# Patient Record
Sex: Female | Born: 1970 | Race: Black or African American | Hispanic: No | Marital: Married | Smoking: Former smoker
Health system: Southern US, Community
[De-identification: ages and names within clinical notes are randomized; demographics above are authoritative.]

## PROBLEM LIST (undated history)

## (undated) DIAGNOSIS — O009 Unspecified ectopic pregnancy without intrauterine pregnancy: Secondary | ICD-10-CM

## (undated) DIAGNOSIS — B351 Tinea unguium: Secondary | ICD-10-CM

## (undated) HISTORY — PX: WISDOM TOOTH EXTRACTION: SHX21

## (undated) HISTORY — DX: Tinea unguium: B35.1

## (undated) HISTORY — PX: LAPAROSCOPIC GASTRIC SLEEVE RESECTION: SHX5895

## (undated) HISTORY — PX: SALPINGECTOMY: SHX328

## (undated) HISTORY — DX: Unspecified ectopic pregnancy without intrauterine pregnancy: O00.90

---

## 2015-03-19 ENCOUNTER — Encounter: Payer: Self-pay | Admitting: Obstetrics & Gynecology

## 2015-03-25 ENCOUNTER — Other Ambulatory Visit: Payer: Self-pay | Admitting: Obstetrics & Gynecology

## 2015-03-25 DIAGNOSIS — Z1231 Encounter for screening mammogram for malignant neoplasm of breast: Secondary | ICD-10-CM

## 2015-04-21 ENCOUNTER — Encounter: Payer: Self-pay | Admitting: Obstetrics & Gynecology

## 2015-04-21 ENCOUNTER — Ambulatory Visit (INDEPENDENT_AMBULATORY_CARE_PROVIDER_SITE_OTHER): Payer: 59 | Admitting: Obstetrics & Gynecology

## 2015-04-21 VITALS — BP 159/85 | HR 73 | Resp 16 | Ht 66.0 in | Wt 198.0 lb

## 2015-04-21 DIAGNOSIS — Z720 Tobacco use: Secondary | ICD-10-CM | POA: Diagnosis not present

## 2015-04-21 DIAGNOSIS — Z1151 Encounter for screening for human papillomavirus (HPV): Secondary | ICD-10-CM | POA: Diagnosis not present

## 2015-04-21 DIAGNOSIS — Z124 Encounter for screening for malignant neoplasm of cervix: Secondary | ICD-10-CM | POA: Diagnosis not present

## 2015-04-21 DIAGNOSIS — Z9884 Bariatric surgery status: Secondary | ICD-10-CM | POA: Diagnosis not present

## 2015-04-21 DIAGNOSIS — I10 Essential (primary) hypertension: Secondary | ICD-10-CM | POA: Diagnosis not present

## 2015-04-21 DIAGNOSIS — Z01419 Encounter for gynecological examination (general) (routine) without abnormal findings: Secondary | ICD-10-CM

## 2015-04-21 DIAGNOSIS — F17200 Nicotine dependence, unspecified, uncomplicated: Secondary | ICD-10-CM

## 2015-04-21 MED ORDER — NICOTINE 7 MG/24HR TD PT24: 7.0000 mg | MEDICATED_PATCH | Freq: Every day | TRANSDERMAL | Status: DC

## 2015-04-21 NOTE — Progress Notes (Signed)
  Subjective:     Donna Hartman is a 44 y.o. female here for a routine exam.  Current complaints: wants to quit smoking.  Interested in nicoderm patches.     Gynecologic History Patient's last menstrual period was 04/17/2015. Contraception: bilateral salpingectomy at time of ectopic surgery Last Pap: 2014 per patient. Results were: normal Last mammogram: 2015. Results were: normal per patient  Obstetric History OB History  Gravida Para Term Preterm AB SAB TAB Ectopic Multiple Living  5 2 2  3 1  0 1  1    # Outcome Date GA Lbr Len/2nd Weight Sex Delivery Anes PTL Lv  5 Term 01/08/98    M CS-Classical  N Y  4 Term     M CS-Classical  N   3 Ectopic           2 AB           1 SAB                The following portions of the patient's history were reviewed and updated as appropriate: allergies, current medications, past family history, past medical history, past social history, past surgical history and problem list.  Review of Systems Pertinent items are noted in HPI.    Objective:      Filed Vitals:   04/21/15 1328  BP: 162/92  Pulse: 73  Resp: 16  Height: 5\' 6"  (1.676 m)  Weight: 198 lb (89.812 kg)   Vitals:  WNL General appearance: alert, cooperative and no distress Head: Normocephalic, without obvious abnormality, atraumatic Eyes: negative Throat: lips, mucosa, and tongue normal; teeth and gums normal Lungs: clear to auscultation bilaterally Breasts: normal appearance, no masses or tenderness, No nipple retraction or dimpling, No nipple discharge or bleeding Heart: regular rate and rhythm Abdomen: soft, non-tender; bowel sounds normal; no masses,  no organomegaly  Pelvic:  External Genitalia:  Tanner V, probable vitiligo at 6 o'clock vagina and onto labia minora.  Sharp demarcation of color.  No lesion or plaque. Urethra:  No prolpase Vagina:  Pink, normal rugae, no blood or discharge Cervix:  No CMT, no lesion Uterus:  Normal size and contour, non  tender Adnexa:  Normal, no masses, non tender  Extremities: no edema, redness or tenderness in the calves or thighs Skin: pale vitiligo on abdomen Lymph nodes: Axillary adenopathy: none        Assessment:    Healthy female exam.   Hypertension Vitiligo Smoker motivated to quit   Plan:    Education reviewed: self breast exams, skin cancer screening and smoking cessation. Contraception: s/p bilateral salpingectomy. Mammogram ordered. Follow up in: 1 year. nicoderm and Strong quit line  F/U with primary care MD for hypertension Mammogram tomorrow Dentis this week Pap with cotesting RTC 1 year Self vulvar exams

## 2015-04-21 NOTE — Patient Instructions (Signed)
Smoking Cessation, Tips for Success  If you are ready to quit smoking, congratulations! You have chosen to help yourself be healthier. Cigarettes bring nicotine, tar, carbon monoxide, and other irritants into your body. Your lungs, heart, and blood vessels will be able to work better without these poisons. There are many different ways to quit smoking. Nicotine gum, nicotine patches, a nicotine inhaler, or nicotine nasal spray can help with physical craving. Hypnosis, support groups, and medicines help break the habit of smoking.  WHAT THINGS CAN I DO TO MAKE QUITTING EASIER?   Here are some tips to help you quit for good:  · Pick a date when you will quit smoking completely. Tell all of your friends and family about your plan to quit on that date.  · Do not try to slowly cut down on the number of cigarettes you are smoking. Pick a quit date and quit smoking completely starting on that day.  · Throw away all cigarettes.    · Clean and remove all ashtrays from your home, work, and car.  · On a card, write down your reasons for quitting. Carry the card with you and read it when you get the urge to smoke.  · Cleanse your body of nicotine. Drink enough water and fluids to keep your urine clear or pale yellow. Do this after quitting to flush the nicotine from your body.  · Learn to predict your moods. Do not let a bad situation be your excuse to have a cigarette. Some situations in your life might tempt you into wanting a cigarette.  · Never have "just one" cigarette. It leads to wanting another and another. Remind yourself of your decision to quit.  · Change habits associated with smoking. If you smoked while driving or when feeling stressed, try other activities to replace smoking. Stand up when drinking your coffee. Brush your teeth after eating. Sit in a different chair when you read the paper. Avoid alcohol while trying to quit, and try to drink fewer caffeinated beverages. Alcohol and caffeine may urge you to  smoke.  · Avoid foods and drinks that can trigger a desire to smoke, such as sugary or spicy foods and alcohol.  · Ask people who smoke not to smoke around you.  · Have something planned to do right after eating or having a cup of coffee. For example, plan to take a walk or exercise.  · Try a relaxation exercise to calm you down and decrease your stress. Remember, you may be tense and nervous for the first 2 weeks after you quit, but this will pass.  · Find new activities to keep your hands busy. Play with a pen, coin, or rubber band. Doodle or draw things on paper.  · Brush your teeth right after eating. This will help cut down on the craving for the taste of tobacco after meals. You can also try mouthwash.    · Use oral substitutes in place of cigarettes. Try using lemon drops, carrots, cinnamon sticks, or chewing gum. Keep them handy so they are available when you have the urge to smoke.  · When you have the urge to smoke, try deep breathing.  · Designate your home as a nonsmoking area.  · If you are a heavy smoker, ask your health care provider about a prescription for nicotine chewing gum. It can ease your withdrawal from nicotine.  · Reward yourself. Set aside the cigarette money you save and buy yourself something nice.  · Look for   support from others. Join a support group or smoking cessation program. Ask someone at home or at work to help you with your plan to quit smoking.  · Always ask yourself, "Do I need this cigarette or is this just a reflex?" Tell yourself, "Today, I choose not to smoke," or "I do not want to smoke." You are reminding yourself of your decision to quit.  · Do not replace cigarette smoking with electronic cigarettes (commonly called e-cigarettes). The safety of e-cigarettes is unknown, and some may contain harmful chemicals.  · If you relapse, do not give up! Plan ahead and think about what you will do the next time you get the urge to smoke.  HOW WILL I FEEL WHEN I QUIT SMOKING?  You  may have symptoms of withdrawal because your body is used to nicotine (the addictive substance in cigarettes). You may crave cigarettes, be irritable, feel very hungry, cough often, get headaches, or have difficulty concentrating. The withdrawal symptoms are only temporary. They are strongest when you first quit but will go away within 10-14 days. When withdrawal symptoms occur, stay in control. Think about your reasons for quitting. Remind yourself that these are signs that your body is healing and getting used to being without cigarettes. Remember that withdrawal symptoms are easier to treat than the major diseases that smoking can cause.   Even after the withdrawal is over, expect periodic urges to smoke. However, these cravings are generally short lived and will go away whether you smoke or not. Do not smoke!  WHAT RESOURCES ARE AVAILABLE TO HELP ME QUIT SMOKING?  Your health care provider can direct you to community resources or hospitals for support, which may include:  · Group support.  · Education.  · Hypnosis.  · Therapy.  Document Released: 09/02/2004 Document Revised: 04/21/2014 Document Reviewed: 05/23/2013  Exit[employer]® Patient Information ©2015 [employer]. This information is not intended to replace advice given to you by your health care provider. Make sure you discuss any questions you have with your health care provider.

## 2015-04-22 ENCOUNTER — Ambulatory Visit (INDEPENDENT_AMBULATORY_CARE_PROVIDER_SITE_OTHER): Payer: 59

## 2015-04-22 DIAGNOSIS — Z1231 Encounter for screening mammogram for malignant neoplasm of breast: Secondary | ICD-10-CM

## 2015-04-22 DIAGNOSIS — R928 Other abnormal and inconclusive findings on diagnostic imaging of breast: Secondary | ICD-10-CM

## 2015-04-23 LAB — CYTOLOGY - PAP

## 2015-04-24 ENCOUNTER — Encounter: Payer: Self-pay | Admitting: Obstetrics & Gynecology

## 2015-04-24 ENCOUNTER — Other Ambulatory Visit: Payer: Self-pay | Admitting: Obstetrics & Gynecology

## 2015-04-24 DIAGNOSIS — R928 Other abnormal and inconclusive findings on diagnostic imaging of breast: Secondary | ICD-10-CM

## 2015-04-24 DIAGNOSIS — Z1231 Encounter for screening mammogram for malignant neoplasm of breast: Secondary | ICD-10-CM | POA: Insufficient documentation

## 2015-04-27 ENCOUNTER — Telehealth: Payer: Self-pay | Admitting: *Deleted

## 2015-04-27 NOTE — Telephone Encounter (Signed)
Called pt to adv further imaging needed on left breast. Pt states she does not have appt scheduled as of now and has not been notified by imaging that further studies are needed. I gave pt phone number to call and schedule imaging. Pt will call back to adv once she has made the appt.

## 2015-04-27 NOTE — Telephone Encounter (Signed)
-----   Message from Lesly DukesKelly H Leggett, MD sent at 04/24/2015  2:03 PM EDT ----- Please make sure patient has f/u for further imaging--document in Epic

## 2015-04-28 ENCOUNTER — Other Ambulatory Visit: Payer: Self-pay | Admitting: Obstetrics & Gynecology

## 2015-04-28 ENCOUNTER — Other Ambulatory Visit: Payer: Self-pay

## 2015-04-28 DIAGNOSIS — R928 Other abnormal and inconclusive findings on diagnostic imaging of breast: Secondary | ICD-10-CM

## 2015-04-29 ENCOUNTER — Ambulatory Visit
Admission: RE | Admit: 2015-04-29 | Discharge: 2015-04-29 | Disposition: A | Discharge status: Home / Self Care | Payer: 59 | Source: Ambulatory Visit | Attending: Obstetrics & Gynecology | Admitting: Obstetrics & Gynecology

## 2015-04-29 DIAGNOSIS — R928 Other abnormal and inconclusive findings on diagnostic imaging of breast: Secondary | ICD-10-CM

## 2016-05-11 DIAGNOSIS — E559 Vitamin D deficiency, unspecified: Secondary | ICD-10-CM | POA: Insufficient documentation

## 2016-07-05 ENCOUNTER — Emergency Department
Admission: EM | Admit: 2016-07-05 | Discharge: 2016-07-05 | Disposition: A | Discharge status: Home / Self Care | Payer: 59 | Source: Home / Self Care | Attending: Emergency Medicine | Admitting: Emergency Medicine

## 2016-07-05 ENCOUNTER — Encounter: Payer: Self-pay | Admitting: Emergency Medicine

## 2016-07-05 DIAGNOSIS — N3000 Acute cystitis without hematuria: Secondary | ICD-10-CM

## 2016-07-05 DIAGNOSIS — B3731 Acute candidiasis of vulva and vagina: Secondary | ICD-10-CM

## 2016-07-05 DIAGNOSIS — B373 Candidiasis of vulva and vagina: Secondary | ICD-10-CM

## 2016-07-05 LAB — POCT URINALYSIS DIP (MANUAL ENTRY)
Bilirubin, UA: NEGATIVE
Blood, UA: NEGATIVE
Glucose, UA: NEGATIVE
Nitrite, UA: POSITIVE — AB
Protein Ur, POC: 30 — AB
Spec Grav, UA: 1.025 (ref 1.005–1.03)
Urobilinogen, UA: 1 (ref 0–1)
pH, UA: 6 (ref 5–8)

## 2016-07-05 MED ORDER — CIPROFLOXACIN HCL 500 MG PO TABS: 500.0000 mg | ORAL_TABLET | Freq: Two times a day (BID) | ORAL | Status: DC

## 2016-07-05 MED ORDER — FLUCONAZOLE 150 MG PO TABS: 150.0000 mg | ORAL_TABLET | Freq: Once | ORAL | Status: DC

## 2016-07-05 NOTE — ED Provider Notes (Signed)
CSN: [id number]     Arrival date & time 07/05/16  1840 History   First MD Initiated Contact with Patient 07/05/16 1858     Chief Complaint  Patient presents with  . Vaginal Itching  . Dysuria   (Consider location/radiation/quality/duration/timing/severity/associated sxs/prior Treatment) HPI 45 year old female. Complains of moderate to severe vaginal itching and mild dysuria for 2 days. She states this started about 7 days after taking an antibiotic prescribed by her dentist after a tooth was pulled. Currently denies any oral or ENT symptoms. She states this feels exactly like a vaginal yeast infection and a mild urinary tract infection which she's had in the remote past. She denies chance of pregnancy. Last menstrual period 06/21/2016. Denies vaginal discharge or abdominal or pelvic or back pain. No nausea or vomiting or fever or chills. She otherwise feels well. She states she is monogamous with husband and she denies there is any chance of STD and declines any pelvic exam or STD testing at this time.  Past Medical History  Diagnosis Date  . Nail fungus   . Ectopic pregnancy    Past Surgical History  Procedure Laterality Date  . Cesarean section      x 2  . Salpingectomy      bilat  . Wisdom tooth extraction    . Laparoscopic gastric sleeve resection    BTL  Family History  Problem Relation Age of Onset  . Cancer Mother     stomach  . Hypertension Maternal Grandmother   . Hypertension Paternal Grandmother   . Cancer Paternal Grandfather     prostate  . HIV Father    Social History  Substance Use Topics  . Smoking status: Current Every Day Smoker -- 0.25 packs/day for 10 years    Types: Cigarettes  . Smokeless tobacco: Never Used  . Alcohol Use: 0.0 oz/week    0 Standard drinks or equivalent per week     Comment: social   OB History    Gravida Para Term Preterm AB TAB SAB Ectopic Multiple Living   0 Review of Systems  All other systems  reviewed and are negative.   Allergies  Lamisil  Home Medications   Prior to Admission medications   Medication Sig Start Date End Date Taking? Authorizing Provider  ciprofloxacin (CIPRO) 500 MG tablet Take 1 tablet (500 mg total) by mouth 2 (two) times daily. For 7 days 07/05/16   Lajean Manes, MD  fluconazole (DIFLUCAN) 150 MG tablet Take 1 tablet (150 mg total) by mouth once. Take 1 now, then may repeat x1 in 4 days, for yeast infection. 07/05/16   Lajean Manes, MD  JUBLIA 10 % SOLN  04/12/15   Historical Provider, MD  nicotine (NICODERM CQ) 7 mg/24hr patch Place 1 patch (7 mg total) onto the skin daily. 04/21/15   Lesly Dukes, MD   Meds Ordered and Administered this Visit  Medications - No data to display  BP 130/85 mmHg  Pulse 85  Temp(Src) 98 F (36.7 C) (Oral)  Resp 16  Ht  (1.676 m)  Wt 194 lb (87.998 kg)  BMI 31.33 kg/m2  SpO2 99% No data found.   Physical Exam  Constitutional: She is oriented to person, place, and time. She appears well-developed and well-nourished. No distress.  HENT:  Head: Normocephalic and atraumatic.  Eyes: Conjunctivae and EOM are normal. Pupils are equal, round, and reactive to light. No scleral  icterus.  Neck: Normal range of motion.  Cardiovascular: Normal rate.   Pulmonary/Chest: Effort normal.  Abdominal: She exhibits no distension.  Musculoskeletal: Normal range of motion.  Neurological: She is alert and oriented to person, place, and time.  Skin: Skin is warm. No rash noted.  Psychiatric: She has a normal mood and affect.  Nursing note and vitals reviewed.   ED Course  Procedures (including critical care time)  Labs Review Labs Reviewed  POCT URINALYSIS DIP (MANUAL ENTRY) - Abnormal; Notable for the following:    Clarity, UA cloudy (*)    Ketones, POC UA trace (5) (*)    Protein Ur, POC =30 (*)    Nitrite, UA Positive (*)    Leukocytes, UA small (1+) (*)    All other components within normal limits  URINE CULTURE     Imaging Review No results found.    MDM   1. Acute cystitis without hematuria   2. Yeast vaginitis    Urine culture sent. She declined any further exam or testing. Treatment options discussed, as well as risks, benefits, alternatives. Patient voiced understanding and agreement with the following plans:  Discharge Medication List as of 07/05/2016  7:37 PM    START taking these medications   Details  ciprofloxacin (CIPRO) 500 MG tablet Take 1 tablet (500 mg total) by mouth 2 (two) times daily. For 7 days, Starting 07/05/2016, Until Discontinued, Normal    fluconazole (DIFLUCAN) 150 MG tablet Take 1 tablet (150 mg total) by mouth once. Take 1 now, then may repeat x1 in 4 days, for yeast infection., Starting 07/05/2016, Normal       Other symptomatic care discussed. Follow-up with your gyn in 5-7 days if not improving, or sooner if symptoms become worse. Precautions discussed. Red flags discussed. Questions invited and answered. Patient voiced understanding and agreement.       Lajean Manesavid Massey, MD 07/05/16 [phone]

## 2016-07-05 NOTE — ED Notes (Signed)
Reports onset of vaginal itching without foul discharge and dysuria 2 days; husband is having itching of groin also and is here to be seen.

## 2016-07-08 ENCOUNTER — Telehealth: Payer: Self-pay

## 2016-07-08 NOTE — Telephone Encounter (Signed)
Feeling better.  Symptoms are resolving.

## 2016-07-09 LAB — URINE CULTURE: Colony Count: >=100000

## 2016-07-10 ENCOUNTER — Telehealth: Payer: Self-pay | Admitting: Emergency Medicine

## 2016-07-10 NOTE — Telephone Encounter (Signed)
Pt informed of results and rx called into pharmacy.  Per Dr Cathren Harsh, antb switched from Cipro to [facility].  TMartin,CMA

## 2016-11-08 ENCOUNTER — Other Ambulatory Visit: Payer: Self-pay | Admitting: Family Medicine

## 2016-11-08 DIAGNOSIS — Z1231 Encounter for screening mammogram for malignant neoplasm of breast: Secondary | ICD-10-CM

## 2016-11-22 ENCOUNTER — Ambulatory Visit (INDEPENDENT_AMBULATORY_CARE_PROVIDER_SITE_OTHER): Payer: 59

## 2016-11-22 DIAGNOSIS — Z1231 Encounter for screening mammogram for malignant neoplasm of breast: Secondary | ICD-10-CM | POA: Diagnosis not present

## 2017-02-09 ENCOUNTER — Ambulatory Visit: Payer: 59 | Admitting: Obstetrics & Gynecology

## 2017-05-02 ENCOUNTER — Ambulatory Visit (INDEPENDENT_AMBULATORY_CARE_PROVIDER_SITE_OTHER): Payer: 59 | Admitting: Obstetrics & Gynecology

## 2017-05-02 ENCOUNTER — Encounter: Payer: Self-pay | Admitting: Obstetrics & Gynecology

## 2017-05-02 VITALS — BP 126/85 | HR 73 | Ht 66.0 in | Wt 185.0 lb

## 2017-05-02 DIAGNOSIS — Z1151 Encounter for screening for human papillomavirus (HPV): Secondary | ICD-10-CM | POA: Diagnosis not present

## 2017-05-02 DIAGNOSIS — Z01419 Encounter for gynecological examination (general) (routine) without abnormal findings: Secondary | ICD-10-CM | POA: Diagnosis not present

## 2017-05-02 DIAGNOSIS — Z124 Encounter for screening for malignant neoplasm of cervix: Secondary | ICD-10-CM

## 2017-05-02 NOTE — Progress Notes (Signed)
Subjective:    Ritta SlotLatoshia Boline is a 46 y.o. M AA P2 91(19 and 46 yo kids) female who presents for an annual exam. The patient has no complaints today except for right nipple itching about 6 months. The patient is sexually active. GYN screening history: last pap: was normal. The patient wears seatbelts: yes. The patient participates in regular exercise: no. Has the patient ever been transfused or tattooed?: yes. The patient reports that there is not domestic violence in her life.   Menstrual History: OB History    Gravida Para Term Preterm AB Living   5 2 2   3 1    SAB TAB Ectopic Multiple Live Births   1 0 1   1      Menarche age: [age] Patient's last menstrual period was 04/12/2017.    The following portions of the patient's history were reviewed and updated as appropriate: allergies, current medications, past family history, past medical history, past social history, past surgical history and problem list.  Review of Systems Pertinent items are noted in HPI.   Married for 4 years, monogamous for 7 years, denies dyspareunia Had BS for contraception. Works at [employer] FH- no breast/gyn/colon cancer Mammogram 12/17   Objective:    BP 126/85   Pulse 73   Ht 5\' 6"  (1.676 m)   Wt 185 lb (83.9 kg)   LMP 04/12/2017   BMI 29.86 kg/m   General Appearance:    Alert, cooperative, no distress, appears stated age  Head:    Normocephalic, without obvious abnormality, atraumatic  Eyes:    PERRL, conjunctiva/corneas clear, EOM's intact, fundi    benign, both eyes  Ears:    Normal TM's and external ear canals, both ears  Nose:   Nares normal, septum midline, mucosa normal, no drainage    or sinus tenderness  Throat:   Lips, mucosa, and tongue normal; teeth and gums normal  Neck:   Supple, symmetrical, trachea midline, no adenopathy;    thyroid:  no enlargement/tenderness/nodules; no carotid   bruit or JVD  Back:     Symmetric, no curvature, ROM normal, no CVA tenderness  Lungs:     Clear  to auscultation bilaterally, respirations unlabored  Chest Wall:    No tenderness or deformity   Heart:    Regular rate and rhythm, S1 and S2 normal, no murmur, rub   or gallop  Breast Exam:    No tenderness, masses, or nipple abnormality  Abdomen:     Soft, non-tender, bowel sounds active all four quadrants,    no masses, no organomegaly  Genitalia:    Normal female without lesion, discharge or tenderness, NSSA, NT, no adnexal masses palpable     Extremities:   Extremities normal, atraumatic, no cyanosis or edema  Pulses:   2+ and symmetric all extremities  Skin:   Skin color, texture, turgor normal, no rashes or lesions  Lymph nodes:   Cervical, supraclavicular, and axillary nodes normal  Neurologic:   CNII-XII intact, normal strength, sensation and reflexes    throughout  .    Assessment:    Healthy female exam.    Plan:     Thin prep Pap smear. with cotesting

## 2017-05-04 LAB — CYTOLOGY - PAP
ADEQUACY: ABSENT
Diagnosis: NEGATIVE
HPV: NOT DETECTED

## 2017-12-29 ENCOUNTER — Other Ambulatory Visit: Payer: Self-pay | Admitting: Family Medicine

## 2017-12-29 DIAGNOSIS — Z1231 Encounter for screening mammogram for malignant neoplasm of breast: Secondary | ICD-10-CM

## 2018-01-24 ENCOUNTER — Ambulatory Visit (INDEPENDENT_AMBULATORY_CARE_PROVIDER_SITE_OTHER): Payer: Managed Care, Other (non HMO)

## 2018-01-24 DIAGNOSIS — Z1231 Encounter for screening mammogram for malignant neoplasm of breast: Secondary | ICD-10-CM

## 2018-09-17 ENCOUNTER — Encounter: Payer: Self-pay | Admitting: Obstetrics & Gynecology

## 2018-09-17 ENCOUNTER — Ambulatory Visit (INDEPENDENT_AMBULATORY_CARE_PROVIDER_SITE_OTHER): Payer: Managed Care, Other (non HMO) | Admitting: Obstetrics & Gynecology

## 2018-09-17 VITALS — BP 151/94 | HR 72 | Resp 16 | Ht 66.0 in | Wt 214.0 lb

## 2018-09-17 DIAGNOSIS — Z01419 Encounter for gynecological examination (general) (routine) without abnormal findings: Secondary | ICD-10-CM | POA: Diagnosis not present

## 2018-09-17 DIAGNOSIS — Z124 Encounter for screening for malignant neoplasm of cervix: Secondary | ICD-10-CM | POA: Diagnosis not present

## 2018-09-17 DIAGNOSIS — Z1151 Encounter for screening for human papillomavirus (HPV): Secondary | ICD-10-CM

## 2018-09-17 DIAGNOSIS — K649 Unspecified hemorrhoids: Secondary | ICD-10-CM

## 2018-09-17 NOTE — Progress Notes (Signed)
Formatting of this note is different from the original.  Subjective:      Emily Nelson is a 47 y.o. female here for a routine exam.  Current complaints: wants to lose weight (recently quit smoking --very exciting).  Still has monthly menses.  Pt complaining of hemorrhoids and would like them removed.     Gynecologic History  Patient's last menstrual period was 08/17/2018.  Contraception: salpingetomy  Last Pap: 2018. Results were: normal  Last mammogram: Feb 2019. Results were: normal    Obstetric History  OB History   Gravida Para Term Preterm AB Living   5 2 2   3 1    SAB TAB Ectopic Multiple Live Births   1 0 1   1     # Outcome Date GA Lbr Len/2nd Weight Sex Delivery Anes PTL Lv   5 Term 01/08/98    M CS-Classical  N LIV   4 Term     M CS-Classical  N    3 Ectopic            2 AB            1 SAB              The following portions of the patient's history were reviewed and updated as appropriate: allergies, current medications, past family history, past medical history, past social history, past surgical history and problem list.    Review of Systems  Pertinent items noted in HPI and remainder of comprehensive ROS otherwise negative.     Objective:       Vitals:    09/17/18 1337   BP: (!) 151/94   Pulse: 72   Resp: 16   Weight: 214 lb (97.1 kg)   Height: 5\' 6"  (1.676 m)     Vitals:  WNL  General appearance: alert, cooperative and no distress    HEENT: Normocephalic, without obvious abnormality, atraumatic  Eyes: negative  Throat: lips, mucosa, and tongue normal; teeth and gums normal   Respiratory: Clear to auscultation bilaterally   CV: Regular rate and rhythm   Breasts:  Normal appearance, no masses or tenderness, no nipple retraction or dimpling   GI: Soft, non-tender; bowel sounds normal; no masses,  no organomegaly; hemorrhoids present.    GU: External Genitalia:  Tanner V, no lesion  Urethra:  No prolapse    Vagina: Pink, normal rugae, no blood or discharge,    Cervix: No CMT, no lesion   Uterus:   Normal size and contour, non tender   Adnexa: Normal, no masses, non tender   Musculoskeletal: No edema, redness or tenderness in the calves or thighs   Skin: No lesions or rash   Lymphatic: Axillary adenopathy: none      Psychiatric: Normal mood and behavior     Assessment:      Healthy female exam.    HTN  Successful smoking cessation!    Plan:     Pap nml last year--Pap due in 2021  Yearly mammogram  Weight loss--restarting exercise; weight wathers option for calorie counting  PCP for other health maintenance  Referral to General surgery for evaluation of hemorrhoid removal.      Electronically signed by Lesly DukesLeggett, Kelly H, MD at 10/08/2018  8:42 AM EDT

## 2018-09-17 NOTE — Progress Notes (Addendum)
Subjective:     Donna Hartman is a 47 y.o. female here for a routine exam.  Current complaints: wants to lose weight (recently quit smoking --very exciting).  Still has monthly menses.  Pt complaining of hemorrhoids and would like them removed.    Gynecologic History Patient's last menstrual period was 08/17/2018. Contraception: salpingetomy Last Pap: 2018. Results were: normal Last mammogram: Feb 2019. Results were: normal  Obstetric History OB History  Gravida Para Term Preterm AB Living  5 2 2   3 1   SAB TAB Ectopic Multiple Live Births  1 0 1   1    # Outcome Date GA Lbr Len/2nd Weight Sex Delivery Anes PTL Lv  5 Term 01/08/98    M CS-Classical  N LIV  4 Term     M CS-Classical  N   3 Ectopic           2 AB           1 SAB              The following portions of the patient's history were reviewed and updated as appropriate: allergies, current medications, past family history, past medical history, past social history, past surgical history and problem list.  Review of Systems Pertinent items noted in HPI and remainder of comprehensive ROS otherwise negative.    Objective:      Vitals:   09/17/18 1337  BP: (!) 151/94  Pulse: 72  Resp: 16  Weight: 214 lb (97.1 kg)  Height: 5\' 6"  (1.676 m)   Vitals:  WNL General appearance: alert, cooperative and no distress  HEENT: Normocephalic, without obvious abnormality, atraumatic Eyes: negative Throat: lips, mucosa, and tongue normal; teeth and gums normal  Respiratory: Clear to auscultation bilaterally  CV: Regular rate and rhythm  Breasts:  Normal appearance, no masses or tenderness, no nipple retraction or dimpling  GI: Soft, non-tender; bowel sounds normal; no masses,  no organomegaly; hemorrhoids present.   GU: External Genitalia:  Tanner V, no lesion Urethra:  No prolapse   Vagina: Pink, normal rugae, no blood or discharge,   Cervix: No CMT, no lesion  Uterus:  Normal size and contour, non tender  Adnexa:  Normal, no masses, non tender  Musculoskeletal: No edema, redness or tenderness in the calves or thighs  Skin: No lesions or rash  Lymphatic: Axillary adenopathy: none     Psychiatric: Normal mood and behavior    Assessment:    Healthy female exam.   HTN Successful smoking cessation!   Plan:   Pap nml last year--Pap due in 2021 Yearly mammogram Weight loss--restarting exercise; weight wathers option for calorie counting PCP for other health maintenance Referral to General surgery for evaluation of hemorrhoid removal.

## 2018-09-19 LAB — CYTOLOGY - PAP
Adequacy: ABSENT
Diagnosis: NEGATIVE
HPV: NOT DETECTED

## 2019-04-29 IMAGING — MG DIGITAL SCREENING BILATERAL MAMMOGRAM WITH TOMO AND CAD
9 series · 9 of 25 positions shown · non-contrast
Comparison: Previous exam(s).

CLINICAL DATA: Screening.

EXAM:
DIGITAL SCREENING BILATERAL MAMMOGRAM WITH TOMO AND CAD

[R CC (1 of 2)]
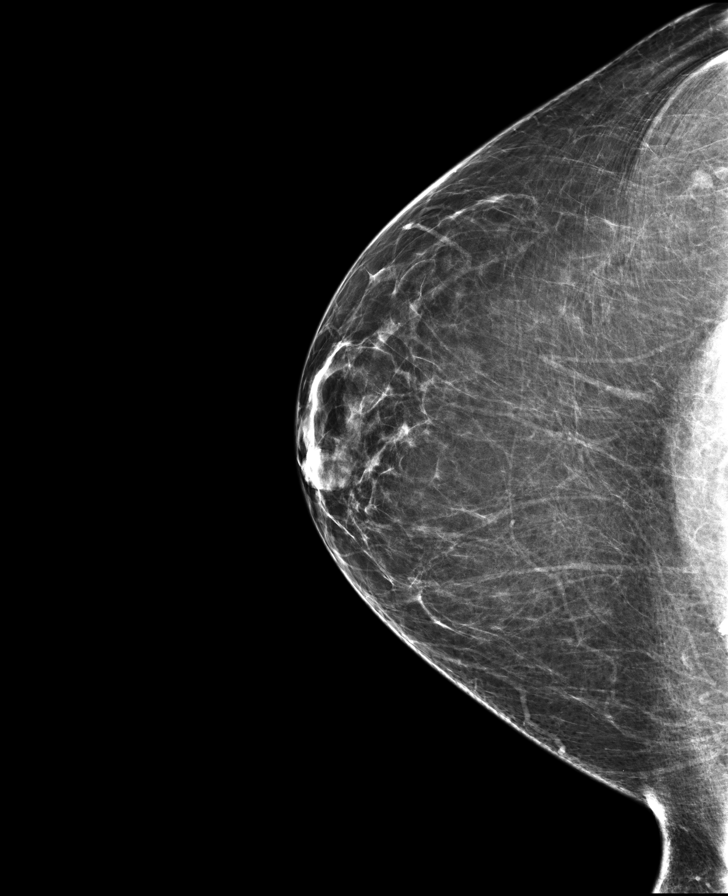

[R MLO]
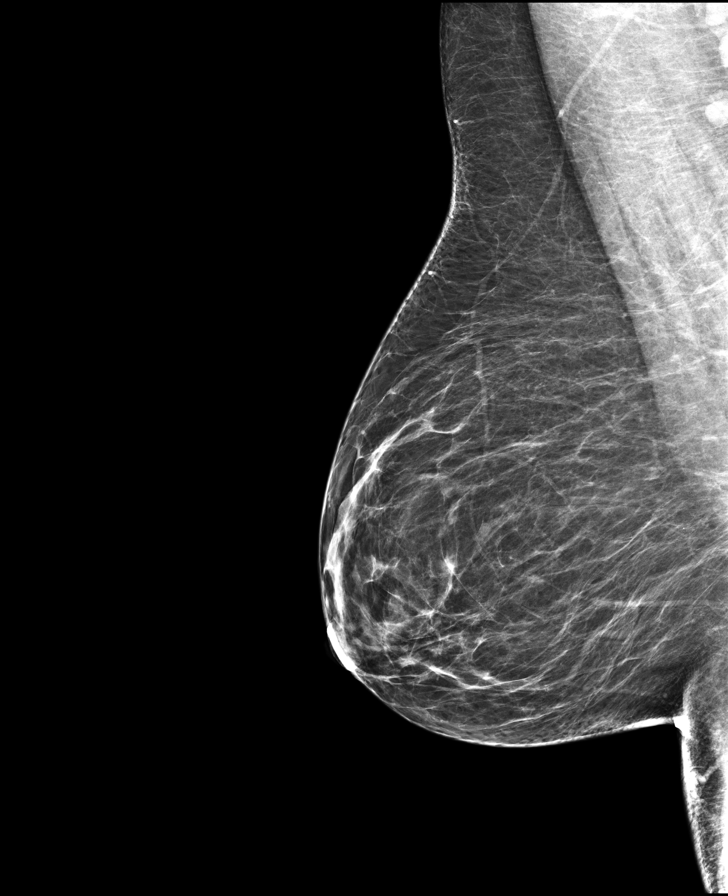

[L MLO]
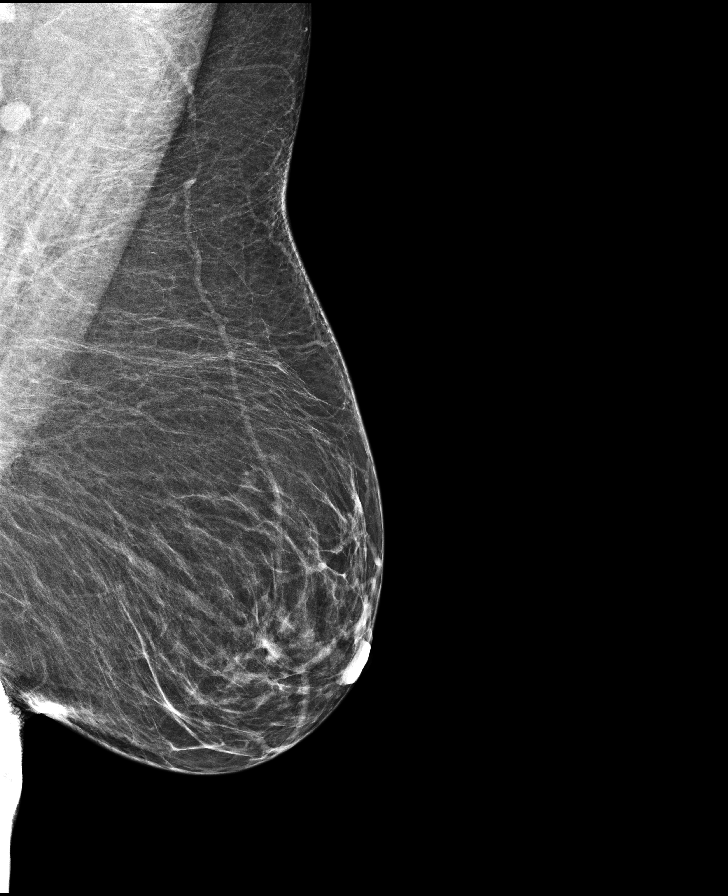

[R CC (2 of 2)]
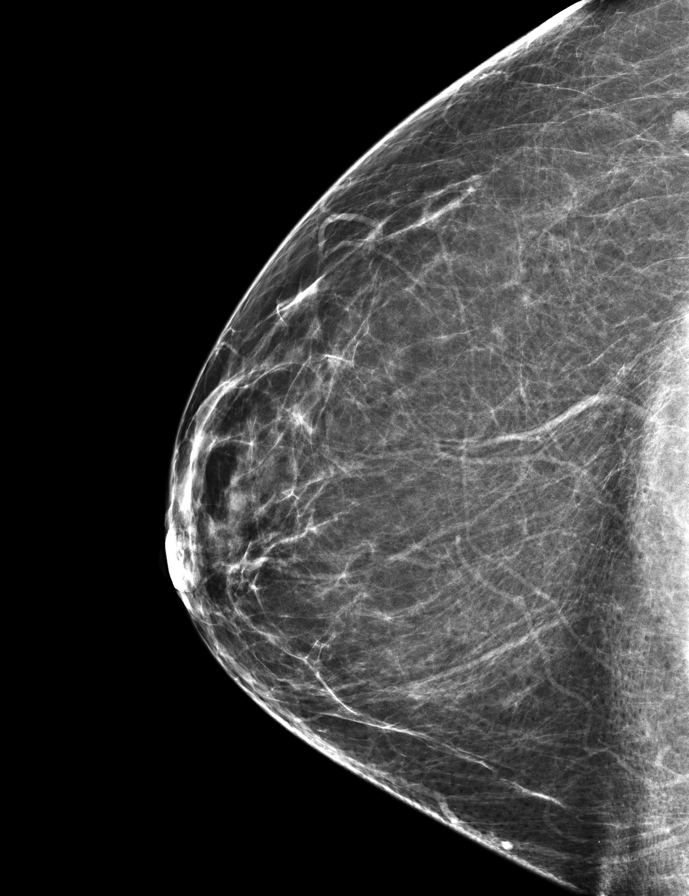

[L CC]
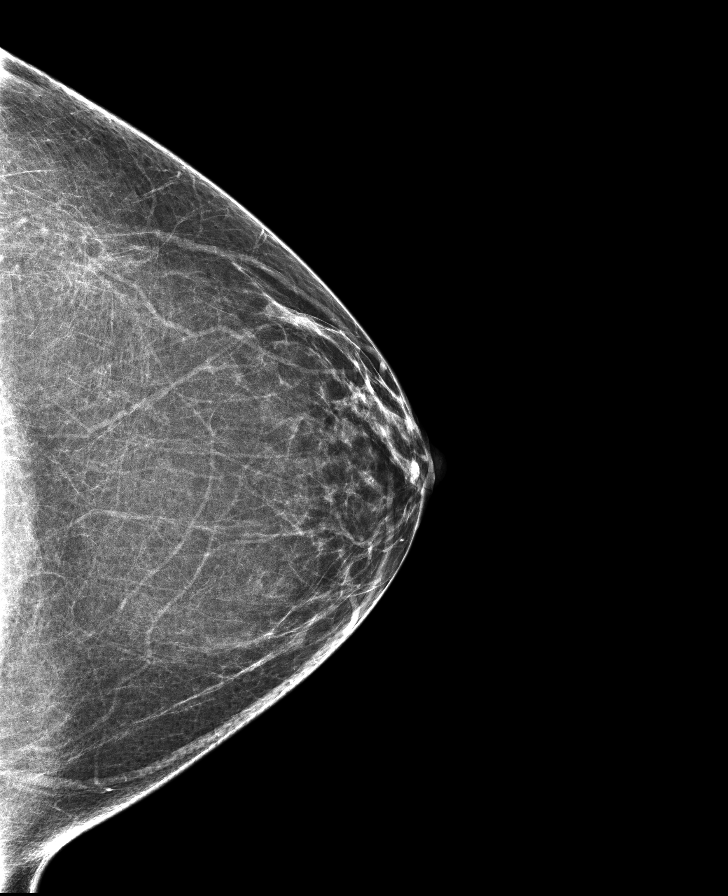

[L MLO tomo · tomo slice 45/88.0]
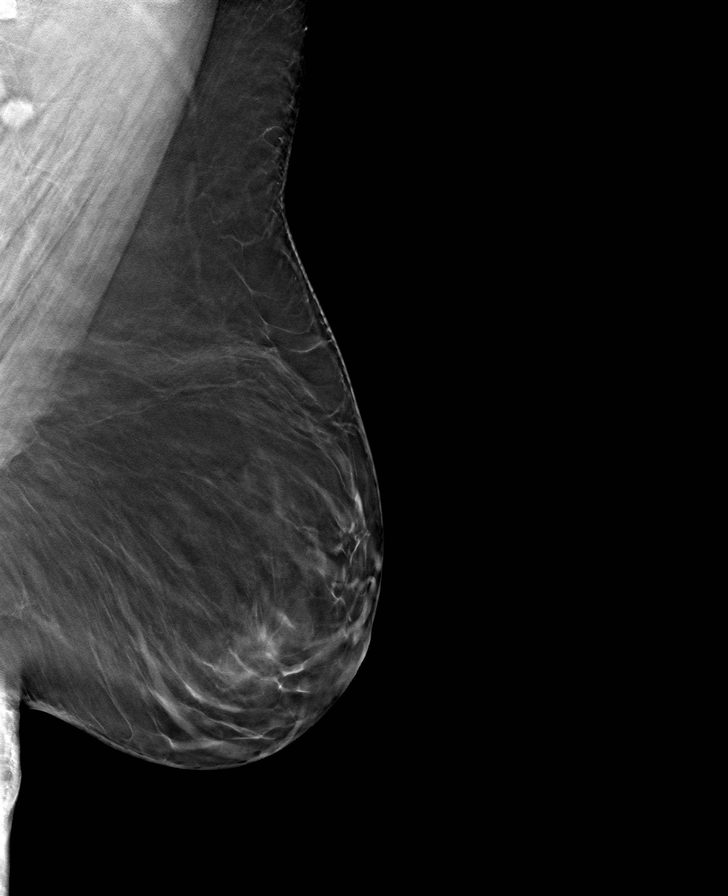

[R CC tomo · tomo slice 41/81.0]
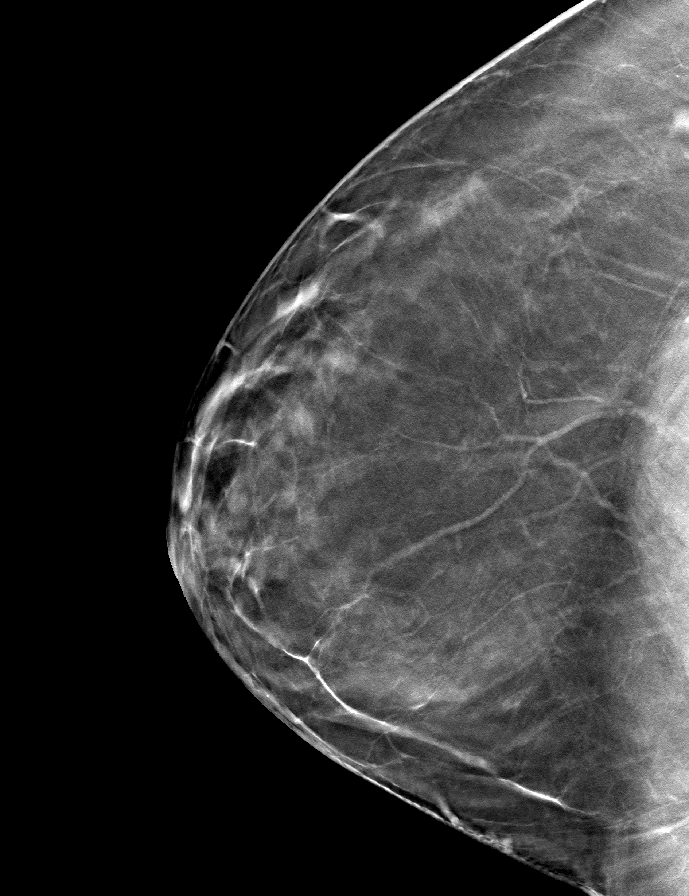

[L CC tomo · tomo slice 41/82.0]
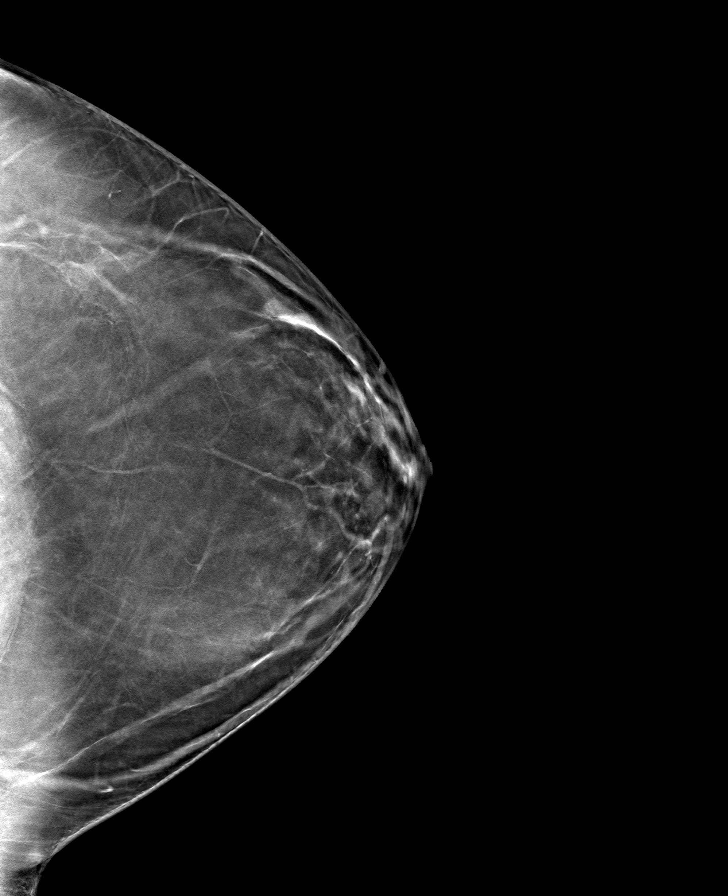

[R MLO tomo · tomo slice 45/90.0]
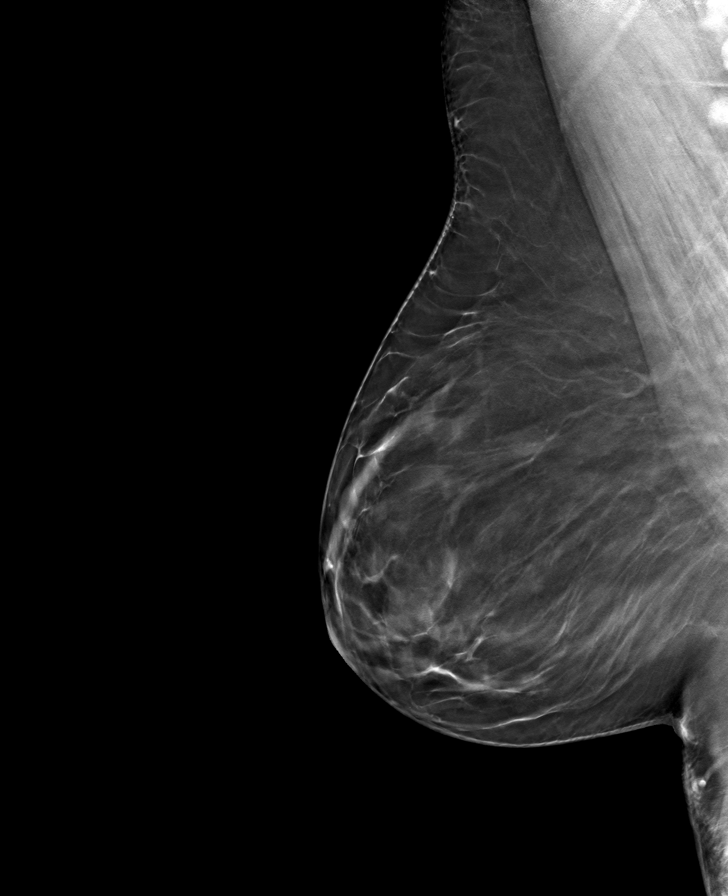

[9 of 25 positions shown; findings below may reference images not displayed]

ACR Breast Density Category b: There are scattered areas of
fibroglandular density.
FINDINGS: There are no findings suspicious for malignancy. Images were
processed with CAD.
IMPRESSION: No mammographic evidence of malignancy. A result letter of this
screening mammogram will be mailed directly to the patient.

RECOMMENDATION:
Screening mammogram in one year. (Code:[id number])

BI-RADS CATEGORY  1: Negative.

## 2021-08-20 NOTE — Telephone Encounter (Signed)
Formatting of this note might be different from the original.      Call [employer]  Call being placed to:: Patient        Call Reason (PT)  What is the reason for the call?: F/U Antiviral COVID RX     Called pt to F/U with antiviral treatment.  Pt only took one days worth and stated the SE of the medications were too much and she no longer could take it.  She is feeling better now and educated pt to not take any more of the paxlovid since she already stopped it.                                             Electronically signed by Steva Colder, PA at 08/20/2021  9:23 AM EDT

## 2021-12-29 ENCOUNTER — Other Ambulatory Visit: Payer: Self-pay | Admitting: Family Medicine

## 2021-12-29 DIAGNOSIS — Z1231 Encounter for screening mammogram for malignant neoplasm of breast: Secondary | ICD-10-CM

## 2022-01-03 NOTE — Progress Notes (Signed)
Formatting of this note is different from the original.      CHIEF COMPLAINT   RIGHT knee pain.    IMPRESSION           1. Chronic pain of right knee    2. Primary osteoarthritis of right knee        PLAN            Reviewed the findings and recommendations with the patient today. Her x-rays reveal some mild degenerative changes with lateral joint line osteophyte formation and some mild to moderate patellofemoral degenerative changes with lateral patellar tilt. We discussed treatment options.     We recommended aspiration and injection of the RIGHT knee. If she does remain symptomatic over time, then we would consider further workup with an MRI of the RIGHT knee for further evaluation.     She may continue activities as tolerated. She understands and agrees with the treatment plan. We performed a RIGHT knee aspiration and corticosteroid injection today in our office, which she tolerated well. The patient will follow up as needed.    INSTRUCTIONS  ? RIGHT knee aspiration and corticosteroid injection were performed in the office today.  ? Continue activities as tolerated.  ? Follow up as needed.    PROCEDURE      Procedures    SUBJECTIVE:      HISTORY OF PRESENT ILLNESS  Emily Nelson is a 51 y.o. female who presents today for evaluation of her RIGHT knee pain. The patient has been experiencing pain in her RIGHT knee for a few years. She states her RIGHT knee pain has gradually progressed, and last year it became worse to the point she was limping. At that time, she obtained x-rays of the RIGHT knee which demonstrated mild arthritis. Today, she reports pain and swelling of the RIGHT knee. She localizes her pain to the lateral aspect of her RIGHT knee. The patient has been utilizing ACE bandage which provides some relief. She states her RIGHT knee pain has improved over the past week. The patient denies any recent injury to her RIGHT knee; however, she mentions that she has fallen on her RIGHT knee twice in the  past. She denies any numbness or tingling in her RIGHT lower extremity. The patient mentions that she has gained some weight and believes that it may be attributing to her RIGHT knee pain.    The patient denies a history of diabetes.    OCCUPATIONAL HISTORY  The patient is employed as an Print production plannerultrasound technician. Her job requires her to stand for a prolonged period of time.     Patient Active Problem List    Diagnosis Date Noted   ? Primary osteoarthritis of right knee 01/05/2022      Right knee aspiration and injection-01/03/2022    ? Grade II hemorrhoids 10/15/2018   ? Microcytic anemia 05/11/2016   ? Mixed hyperlipidemia 05/11/2016   ? Tinea unguium 05/11/2016   ? Vitamin D deficiency 05/11/2016   ? Essential hypertension 04/06/2016   ? Obesity (BMI 30-39.9) 04/06/2016   ? Tobacco use disorder, continuous 04/06/2016     Overview:   recommend cessation.    ? History of gastric bypass 04/21/2015     Overview:   sleeve      Current Outpatient Medications   Medication Sig Dispense Refill   ? ergocalciferol (ERGOCALCIFEROL) 1.25 MG (50000 UT) capsule Take one capsule (50,000 Units dose) by mouth once a week. 4 capsule 2   ? fluconazole (DIFLUCAN)  150 mg tablet Take 1 tablet by mouth now and repeat in 3 days 2 tablet 0     No current facility-administered medications for this visit.     No past medical history on file.  Past Surgical History:   Procedure Laterality Date   ? Cesarean section     ? Dilation and curettage of uterus     ? Tubal ligation      Removed     Social History     Social History Narrative   ? Not on file     Allergies   Allergen Reactions   ? Influenza Vaccine Live Hives   ? Terbinafine Hives   ? Fluvastatin Hives   ? Lamisil Hives     Review of Systems    Past Medical History, Past Surgery History, Allergies, Social History, and Family History were reviewed and updated.     OBJECTIVE       PHYSICAL EXAMINATION:    There were no vitals filed for this visit.     Constitutional  ? General Appearance:  Well-developed and well-nourished. Generally healthy appearing.    Psychiatric  ? Orientation to Time, Place, Person: Oriented.  ? Attention span and Concentration: Appropriate.  ? Mood and Affect: Alert. Pleasant and cooperative.    Musculoskeletal  ? Examination: RIGHT knee and lower extremity  Small to moderate sized effusion. No erythema or warmth. Mild valgus alignment. Mild lateral peripatellar tenderness. Some reproducible lateral joint line tenderness consistent with some of her pain. Good range of motion with full extension and over 115 degrees of flexion. No pain with flexion or rotation of the hip. Lower extremity neurovascularly intact.    IMAGING      XR Knee 3 Views Right  Narrative: IMAGING:   X-RAYS: Standing AP, lateral, and sunrise view of the RIGHT knee. These   were taken and reviewed in the office today.   INTERPRETATION: No acute findings.  Impression: Mild lateral joint line narrowing and   degenerative changes.  Moderate patellofemoral degenerative change.    LABS     Recent Results (from the past 336 hour(s))   IGP, HPV, Aptima High-Risk    Collection Time: 12/28/21 11:51 AM   Result Value Ref Range    Interpretation NILM     Category NIL     Adequacy SECNI     Clinician provided ICD10 Comment     Performed By: Comment     Gyn Note Comment     Test Methodology Comment     HPV Aptima Negative Negative   POCT WET PREP W/ KOH    Collection Time: 12/29/21  2:25 PM   Result Value Ref Range    Clue Cells None None    Yeast Few (A) None    Hyphae Rare (A) None    Trichomonas None None    WBC Rare     RBC Wet Prep      Epithelials, Wet Prep      Bacteria, Wet Prep      KOH, POC      Comment      Specimen Source Vaginal    POCT vaginal pH    Collection Time: 12/29/21  2:25 PM   Result Value Ref Range    PH, VAGINAL 4.0      Portions of the history and exam were entered using voice recognition software.  Minor syntax, contextual, and spelling errors may be related to the use of this software and were  not intentional.  If corrections are necessary please contact the provider or office.    ATTESTATION  I have reviewed the scribed information for accuracy.  Paulita Cradle, MD   01/05/2022 10:34 PM    Directed by Elinor Dodge, MD and reviewed by Azzie Almas.     This note was composed using a IT sales professional service. The documentation was reviewed for accuracy and completeness. Obtaining the history, performing the physical exam, and formulating the assessment and plan were performed by Elinor Dodge, MD.    Electronically signed by Paulita Cradle, MD at 01/05/2022 10:34 PM EST

## 2022-01-03 NOTE — Progress Notes (Signed)
Associated Order(s): Large joint injection/arthrocentesis: R knee  Formatting of this note might be different from the original.  Large joint injection/arthrocentesis: R knee  Date/Time: 01/03/2022 11:46 AM  Consent given by: patient  Supporting Documentation  Indications: pain and joint swelling   Procedure Details  Location: knee - R knee  Preparation: Patient was prepped and draped in the usual sterile fashion  Needle size: 20 G  Approach: lateral  Medications administered: 6 mg betamethasone sodium phospate & acetate 6 (3-3) MG/ML (mixed with 23ml of bupivacaine.  ndc:  1791-5056-97, lot:  [id number].  exp:  01/19/2023)  Aspirate: yellow and clear  Aspirate amount: 15 mL  Patient tolerance: patient tolerated the procedure well with no immediate complications      Electronically signed by Paulita Cradle, MD at 01/05/2022 10:34 PM EST

## 2022-01-05 ENCOUNTER — Ambulatory Visit: Payer: Managed Care, Other (non HMO)

## 2022-06-01 NOTE — Progress Notes (Signed)
Associated Order(s): *L Inj/Asp: R knee  Post-Procedure Diagnose(s): Primary osteoarthritis of right knee  Formatting of this note is different from the original.  CHIEF COMPLAINT  Chief Complaint   Patient presents with    Right Knee - Pain     HISTORY OF PRESENT ILLNESS:  This is a 51 y.o. female patient who presents for evaluation of right knee acute on chronic pain.  No history of specific injury.  Pain is longstanding but has acutely worsened over the past couple of months and led the patient to present for evaluation.  The patient was previously seen with [facility], diagnosed with right knee osteoarthritis, and given a corticosteroid injection/aspiration.  The patient notes this injection helped her for approximately 2 months.  She is now out of network with [facility] in his establishing care with our office.  The pain is moderate, and is worse with certain activities including bending, squatting, and prolonged walking. Daily activities are significantly affected.  Rest slightly improves the symptoms.  No fevers or constitutional complaints.    Previous injections:  Corticosteroid injection  Previous surgery:  01/03/2022   Previous medications:  Turmeric    PHYSICAL EXAM:  Patient is well developed, well nourished.  Non-labored breathing.  Awake, alert, oriented x 3. Appropriate mood, antalgic gait. Body mass index is 32.28 kg/m.  Assistive devices:  None  No visible rashes, skin lesions, or lymphadenopathy.   Lumbar spine is non-tender, normoreflexive. Normal ROM hip.     Knee examination:  Right knee ROM 0-105 degrees.  neutral alignment. 5/5 strength knee extension/flexion.    Medial and lateral joint line tenderness.   Peri-patellar tenderness. 1+ joint effusion, moderate baker's cyst  No clinically significant quadriceps atrophy.   Positive patellar compression test and palpable crepitance. No patellar apprehension. Grossly neutral tracking.   Pain with squat.  Negative Lachman and  drawer tests. Stable varus and valgus stress tests.  Sensation is intact to light touch throughout the lower extremities.   Calf supple. Negative Homan.   Brisk capillary refill and feet warm well perfused.     Contralateral lower extremity examination demonstrates grossly normal range of motion throughout, no overt tenderness to palpation, well-perfused.  5/5 strength, no skin lesions, no instability. Sensation within normal. Compartments soft.    RADIOGRAPHS:  Radiographs of the right knee obtained and interpreted today including AP, lateral, flexion weight-bearing, and Merchant views.  There is significant medial and lateral joint space narrowing. No evidence of fracture, no bone lesions.  Moderate to severe osteophyte formation.    DIAGNOSIS:  Right knee primary moderate to advanced osteoarthritis flare    PLAN:  The patient has evidence of acute on chronic arthritic flare on the knee.  We discussed the option of additional conservative treatment consisting of oral anti-inflammatory medication, quadriceps strengthening exercises, and injections. We also discussed the possibility of surgery.  Given the patient's lack of response to corticosteroid injection, we will move forward with pre certifying a viscosupplementation injection.  She will return the office to receive this injection once it is available.      This note has been created with voice recognition software.  While this note has been edited for accuracy, the software periodically misinterprets speech resulting in errors that might not have been caught in editing.  In the event you find an unusual error in this record, please notify us at [phone] to resolve the same.    Patient ID: Emily Nelson is a 51 y.o. female.    *  L Inj/Asp: R knee on 06/01/2022 4:00 PM  Indications: pain  Details: 22 G needle, anterolateral approach  Medications: 40 mg triamcinolone acetonide 40 MG/ML; 2 mL lidocaine 1 %  Outcome: tolerated well, no immediate  complications  Procedure, treatment alternatives, risks and benefits explained, specific risks discussed. Consent was given by the patient. Immediately prior to procedure a time out was called to verify the correct patient, procedure, equipment, support staff and site/side marked as required. Patient was prepped and draped in the usual sterile fashion.       Electronically signed by Shon Baton, PA-C at 06/01/2022  4:38 PM EDT

## 2022-10-13 ENCOUNTER — Encounter: Admit: 2022-10-13 | Discharge: 2022-10-13 | Attending: Orthopaedic Surgery

## 2022-10-13 ENCOUNTER — Ambulatory Visit: Admit: 2022-10-13 | Discharge: 2022-11-20 | Payer: PRIVATE HEALTH INSURANCE

## 2022-10-13 DIAGNOSIS — M25561 Pain in right knee: Secondary | ICD-10-CM

## 2022-10-13 DIAGNOSIS — M25562 Pain in left knee: Secondary | ICD-10-CM

## 2022-10-13 MED ORDER — TRIAMCINOLONE ACETONIDE 40 MG/ML IJ SUSP
40 MG/ML | Freq: Once | INTRAMUSCULAR | Status: AC
Start: 2022-10-13 — End: 2022-10-13
  Administered 2022-10-13: 15:00:00 40 mg via INTRA_ARTICULAR

## 2022-10-13 NOTE — Progress Notes (Addendum)
Name: Claudine Stallings  Date of Birth: 03-27-1971  Gender: female  MRN: [id number]      CC: Knee Pain (R)       HPI: Errica Dutil is a 51 y.o. female who presents with Knee Pain (R)  Pain has been going on for a year but in the last six months her pain has progressively gotten worse. She has had two cortisone shots in the past year. The last injection being 6 months ago.  Pain is on the lateral side of her knee. She believes it is arthritis. When she was younger, she fell a few times and believes it couldve stemmed from that. Does get swollen on occasion.  She complains of pain with flexion and extension.  She states that she struggles with walking for long distances but she still able to do it.  She does note she got relief from both of her previous knee injections the first 1 lasting about 2 months and the second 1 only lasting about a month.  She does note that before the first injection the knee was aspirated and it was not aspirated for the second injection.  She thinks this is why the second injection did not last as long.      ROS/Meds/PSH/PMH/FH/SH: I personally reviewed the patients standard intake form.  Below are the pertinents    Tobacco:  reports that she has never smoked. She has never used smokeless tobacco.  Diabetes: none  Other: HTN    Physical Examination:  General: no acute distress  Lungs: breathing easily  CV: regular rhythm by pulse  Right Knee: No previous surgical scars present. No joint effusion present. Patella tracks centrally with 1+ patellofemoral grind.  Valgus knee mechanical alignment present. Passive ROM: 0 degrees of hyperextension with 120 degrees of flexion. Stable to varus and valgus stress at full extension and 30 degrees of flexion. Negative Lachman's.  Negative posterior drawer. Pain with McMurrays over lateral joint line. Calf is soft and nontender to palpation. Motor and sensory intact distally. Dorsalis pedis pulse 2+.        Imaging:   Knee XR: 4 views      Clinical Indication  1. Acute pain of left knee    2. Right knee pain, unspecified chronicity           Report: AP, lateral, PA flexion, sunrise views of the Left knee demonstrates no acute fracture dislocation.  She does have decreased of the lateral joint line.  She has some osteophyte formation present medial and laterally.  On the skiers view she has Koebel pleat collapse of the lateral joint line.  Decreased patellofemoral joint space.    Impression: Lateral compartment osteoarthritis as above           All imaging interpreted by myself Scott T. Claudette Laws, MD independent of radiology review        Assessment:     ICD-10-CM    1. Acute pain of left knee  M25.562       2. Right knee pain, unspecified chronicity  M25.561 XR KNEE RIGHT (MIN 4 VIEWS)     triamcinolone acetonide (KENALOG-40) injection 40 mg     DRAIN/INJECT LARGE JOINT/BURSA     DRAIN/INJECT LARGE JOINT/BURSA     Ambulatory Referral to DME     Ambulatory Referral to Ortho Injection          Plan:   We discussed multiple treatment options today.  We discussed physical therapy and NSAID therapy.  We also  discussed bracing therapy.  She does have a valgus deformity that I think would improve with a valgus unloader brace.  She stated that she would want to try this we measured her for this today.  We also discussed another cortisone injection.  Her last cortisone injection was 6 months ago and the knee was not aspirated before it was put in.  She states that she would like to proceed with this today but also would like to get approved for Visco injections.  We spent a about 10 minutes discussing Visco injections what they would cost what their benefit are and the improvement rate I have seen.  She states that she would like to see if he declines insurance will pay for them and then will decide if she wants to proceed.    The patient elected to proceed with an ration and intraarticular knee injection today.  After verbal informed consent was obtained  after sterile prep the right knee  was aspirated injected from a superior lateral approach with 4 cc of 0.25% Marcaine and 1 cc of 40 mg Kenalog.  Got about 15 cc of serous fluid off the knee before I injected.  The patient tolerated the procedure well was given postinjection flare precautions.      Talitha Givens, PA-C dictating as a scribe for Dr. Shon Baton.     Scott T. Shon Baton MD, [place] [facility]

## 2022-10-17 NOTE — Telephone Encounter (Signed)
Left vm for patient to call and sched 1st injection rt knee with Dr Lorretta Harp

## 2022-10-27 ENCOUNTER — Encounter: Admit: 2022-10-27 | Discharge: 2022-10-27 | Payer: PRIVATE HEALTH INSURANCE | Attending: Orthopaedic Surgery

## 2022-10-27 ENCOUNTER — Encounter: Admit: 2022-10-27 | Discharge: 2022-10-27 | Payer: PRIVATE HEALTH INSURANCE

## 2022-10-27 DIAGNOSIS — M1711 Unilateral primary osteoarthritis, right knee: Secondary | ICD-10-CM

## 2022-10-27 DIAGNOSIS — M25561 Pain in right knee: Secondary | ICD-10-CM

## 2022-10-27 MED ORDER — SODIUM HYALURONATE (VISCOSUP) 20 MG/2ML IX SOSY
20 MG/2ML | Freq: Once | INTRA_ARTICULAR | Status: AC
Start: 2022-10-27 — End: ?

## 2022-10-27 NOTE — Progress Notes (Signed)
Name: Emily Nelson  Date of Birth: [birth year]  Gender: female  MRN: [id number]      CC: right knee pain follow-up     HPI: Emily Nelson is a 51 y.o. female who returns for follow up on right knee.  She states the cortisone injection given to her last time it did help but it starting to wear off.  She was approved by insurance to get the Visco injection she would like to proceed with this today.  She has not gotten her this unloader brace today but she is going to a DME today to pick this up.  She was fitted on her last appointment for the unloader brace.    Physical Examination:  General: no acute distress  Lungs: breathing easily  CV: regular rhythm by pulse  Right Knee: No previous surgical scars present. No joint effusion present. Patella tracks centrally with 1+ patellofemoral grind.  Valgus knee mechanical alignment present. Passive ROM: 0 degrees of hyperextension with 120 degrees of flexion. Stable to varus and valgus stress at full extension and 30 degrees of flexion. Negative Lachman's.  Negative posterior drawer. Pain with McMurrays over lateral joint line. Calf is soft and nontender to palpation. Motor and sensory intact distally. Dorsalis pedis pulse 2+.         Assessment:     ICD-10-CM    1. Primary osteoarthritis of right knee  M17.11           Plan:     She is here today for her first Visco injection.  She is getting her valgus unloader brace today which I do think will help her tremendously.  She continues to work on her physical therapy and take an NSAID daily.  She states the last cortisone injection I gave her did help with her pain and is still helping some but her pain is starting to return.    The patient elected to proceed with an intraarticular knee injection today.  After verbal informed consent was obtained after sterile prep the right knee  was injected from a anterior lateral approach with 2 ml of Euflexxa.  The patient tolerated the procedure well was given postinjection  flare precautions.      Marye Round, PA-C   Orthopaedics and Sports Medicine

## 2022-10-27 NOTE — Progress Notes (Signed)
Patient was fitted and instructed by Velna Ochs for a Knee Orthosis Single Upright Prefab Knee Brace for the right knee. Patient read and signed documenting they understand and agree to [facility] current DME return policy.

## 2022-11-01 ENCOUNTER — Ambulatory Visit: Admit: 2022-11-01 | Discharge: 2022-11-01 | Payer: PRIVATE HEALTH INSURANCE

## 2022-11-01 DIAGNOSIS — M1711 Unilateral primary osteoarthritis, right knee: Secondary | ICD-10-CM

## 2022-11-01 MED ORDER — SODIUM HYALURONATE (VISCOSUP) 20 MG/2ML IX SOSY
20 MG/2ML | Freq: Once | INTRA_ARTICULAR | Status: AC
Start: 2022-11-01 — End: 2022-11-01
  Administered 2022-11-01: 13:00:00 20 mg via INTRA_ARTICULAR

## 2022-11-01 NOTE — Progress Notes (Signed)
Name: Emily Nelson  Date of Birth: 1971-10-17  Gender: female  MRN: [id number]      CC: right knee pain follow-up      HPI: Emily Nelson is a 51 y.o. female who returns for follow up on right knee. She returns for   Her second Euflexxa shot today.  She states after the first shot she was in a decent amount of pain.  She states that her swelling has gotten worse over the past week and would like it drained today.      Physical Examination:  General: no acute distress  Lungs: breathing easily  CV: regular rhythm by pulse  Right Knee:  Joint effusion present today.  Range of motion continues to be 0 to 120 degrees.  Ligamentously stable x4.  Sensation intact distally.  Dorsi and plantarflexion X intact.  Dorsalis pedis pulse 2+.      Assessment:     ICD-10-CM    1. Primary osteoarthritis of right knee  M17.11           Plan:    I do think with the management effusion she has today is appropriate to drain the knee.  She did receive her unloader brace which she states does help with pain but is uncomfortable to wear.  She is using it on and off.    The patient elected to proceed with an intraarticular knee and aspiration injection today.  After verbal informed consent was obtained after sterile prep the right knee  was injected from a superior lateral approach with 2 mL of visco. Before the injection was given 20 cc of serous fluid was aspirated from the knee. The patient tolerated the procedure well was given postinjection flare precautions.      Marye Round, PA-C   Orthopaedics and Sports Medicine

## 2022-11-08 ENCOUNTER — Ambulatory Visit: Admit: 2022-11-08 | Discharge: 2022-11-08 | Payer: PRIVATE HEALTH INSURANCE

## 2022-11-08 DIAGNOSIS — M1711 Unilateral primary osteoarthritis, right knee: Secondary | ICD-10-CM

## 2022-11-08 MED ORDER — SODIUM HYALURONATE (VISCOSUP) 20 MG/2ML IX SOSY
20 MG/2ML | Freq: Once | INTRA_ARTICULAR | Status: AC
Start: 2022-11-08 — End: 2022-11-08
  Administered 2022-11-08: 14:00:00 20 mg via INTRA_ARTICULAR

## 2022-11-08 NOTE — Progress Notes (Signed)
Name: Emily Nelson  Date of Birth: 1971/08/21  Gender: female  MRN: [id number]      CC: Knee Pain (R)       HPI: Emily Nelson is a 51 y.o. female who presents with Knee pain (R). She is here for her 3rd Euflexxa injection.  She does notice a difference with these injections.    Physical Examination:  General: no acute distress  right Knee: Exam is unchanged, the knee continues to be painful with palpation and range of motion.  Concern infection.  She does have a joint effusion today.    Assessment:   1. Primary osteoarthritis of right knee         Plan:   Right knee injected from a superior lateral approach with 2 mL Euflexxa viscosupplementation after sterile prep.  Patient tolerated the procedure well was given postinjection flare precautions.    Given a Tubigrip today.  I did aspirate this knee and got about 25 cc of clear yellow fluid.  Follow up 6-8 weeks    Marye Round PA-C  Orthopaedics and Sports Medicine
